# Patient Record
Sex: Male | Born: 1995 | Race: White | Hispanic: No | Marital: Single | Smoking: Never smoker
Health system: Southern US, Community
[De-identification: ages and names within clinical notes are randomized; demographics above are authoritative.]

---
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
## 2014-05-16 ENCOUNTER — Emergency Department: Payer: Self-pay | Admitting: Emergency Medicine

## 2014-05-16 LAB — URINALYSIS, COMPLETE
BACTERIA: NONE SEEN
BILIRUBIN, UR: NEGATIVE
BLOOD: NEGATIVE
GLUCOSE, UR: NEGATIVE mg/dL (ref 0–75)
Ketone: NEGATIVE
LEUKOCYTE ESTERASE: NEGATIVE
Nitrite: NEGATIVE
PH: 7 (ref 4.5–8.0)
PROTEIN: NEGATIVE
RBC,UR: <1 /HPF (ref 0–5)
SPECIFIC GRAVITY: 1.018 (ref 1.003–1.030)
Squamous Epithelial: NONE SEEN

## 2014-05-16 LAB — COMPREHENSIVE METABOLIC PANEL
ALK PHOS: 60 U/L
ANION GAP: 7 (ref 7–16)
Albumin: 3.8 g/dL (ref 3.4–5.0)
BUN: 10 mg/dL (ref 7–18)
Bilirubin,Total: 0.4 mg/dL (ref 0.2–1.0)
CALCIUM: 8.8 mg/dL (ref 8.5–10.1)
Chloride: 106 mmol/L (ref 98–107)
Co2: 26 mmol/L (ref 21–32)
Creatinine: 1.23 mg/dL (ref 0.60–1.30)
EGFR (African American): >60
Glucose: 118 mg/dL — ABNORMAL HIGH (ref 65–99)
Osmolality: 278 (ref 275–301)
POTASSIUM: 3.4 mmol/L — AB (ref 3.5–5.1)
SGOT(AST): 26 U/L (ref 15–37)
SGPT (ALT): 27 U/L
SODIUM: 139 mmol/L (ref 136–145)
TOTAL PROTEIN: 7.6 g/dL (ref 6.4–8.2)

## 2014-05-16 LAB — CBC WITH DIFFERENTIAL/PLATELET
Basophil #: 0.1 10*3/uL (ref 0.0–0.1)
Basophil %: 1.1 %
EOS ABS: 0.1 10*3/uL (ref 0.0–0.7)
Eosinophil %: 1.5 %
HCT: 45.7 % (ref 40.0–52.0)
HGB: 15.3 g/dL (ref 13.0–18.0)
LYMPHS PCT: 28.6 %
Lymphocyte #: 2.6 10*3/uL (ref 1.0–3.6)
MCH: 27.8 pg (ref 26.0–34.0)
MCHC: 33.6 g/dL (ref 32.0–36.0)
MCV: 83 fL (ref 80–100)
MONO ABS: 0.5 x10 3/mm (ref 0.2–1.0)
Monocyte %: 5.4 %
NEUTROS ABS: 5.8 10*3/uL (ref 1.4–6.5)
NEUTROS PCT: 63.4 %
Platelet: 213 10*3/uL (ref 150–440)
RBC: 5.52 10*6/uL (ref 4.40–5.90)
RDW: 13.8 % (ref 11.5–14.5)
WBC: 9.1 10*3/uL (ref 3.8–10.6)

## 2015-05-03 IMAGING — CT CT ABD-PELV W/ CM
2 of 4 series · 17 of 46 positions shown, 19 images · IV contrast (isovue)
Comparison: None.

CLINICAL DATA: Right lower quadrant abdominal pain for 1 week.

EXAM:
CT ABDOMEN AND PELVIS WITH CONTRAST
TECHNIQUE: Multidetector CT imaging of the abdomen and pelvis was performed
using the standard protocol following bolus administration of
intravenous contrast.
CONTRAST:  100 mL of Isovue 300 IV contrast

[Series 2: routine abd pel with · axial · 0.70mm/px · z∈[-506,-56]mm · 14 of 98 slices shown, 16 images]
[im 4/98  soft-tissue]
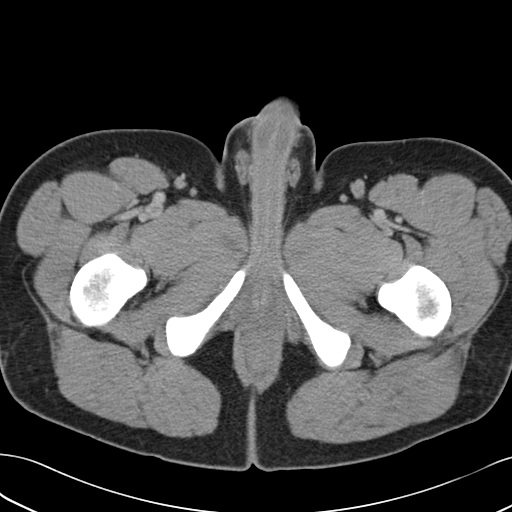
[im 4/98  bone]
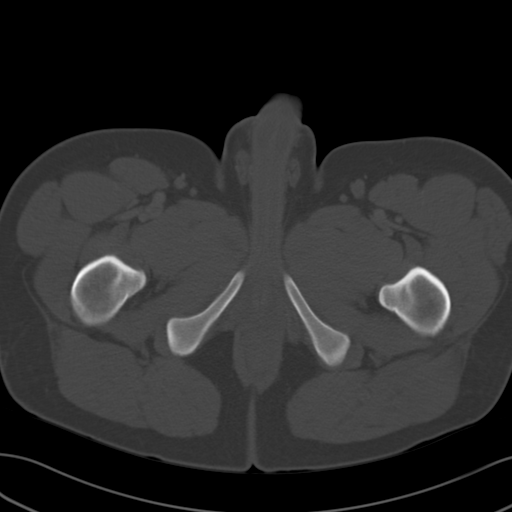
[im 12/98  soft-tissue]
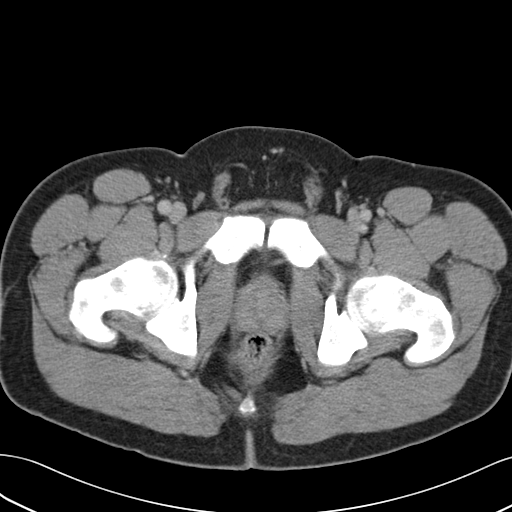
[im 20/98  soft-tissue]
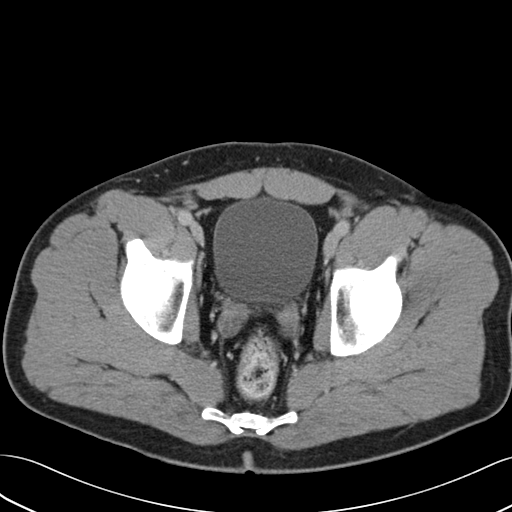
[im 28/98  soft-tissue]
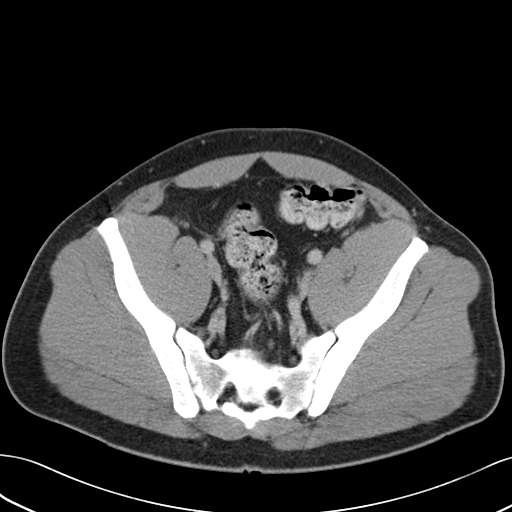
[im 32/98  soft-tissue]
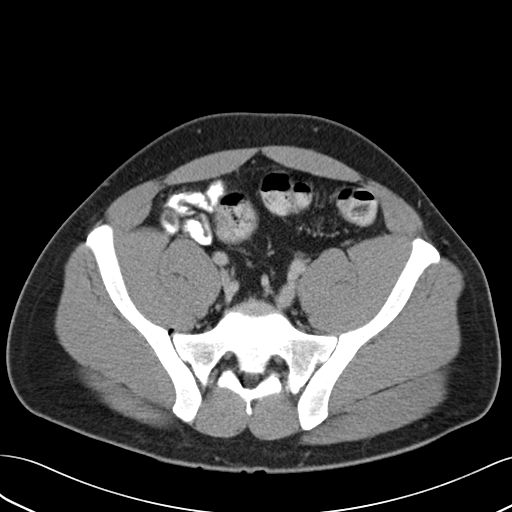
[im 39/98  soft-tissue]
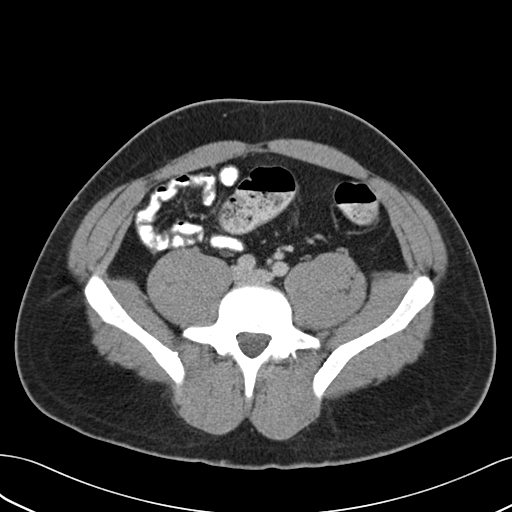
[im 47/98  soft-tissue]
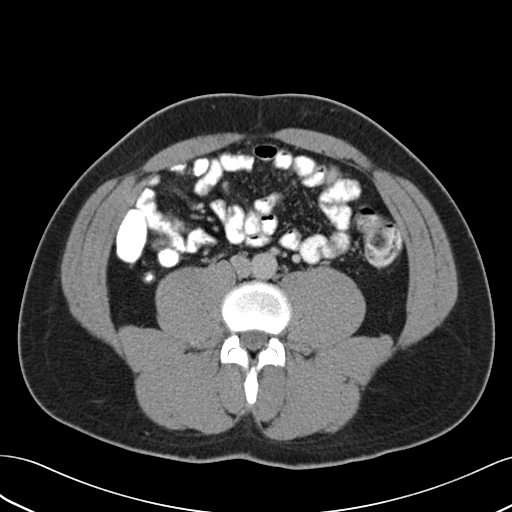
[im 51/98  soft-tissue]
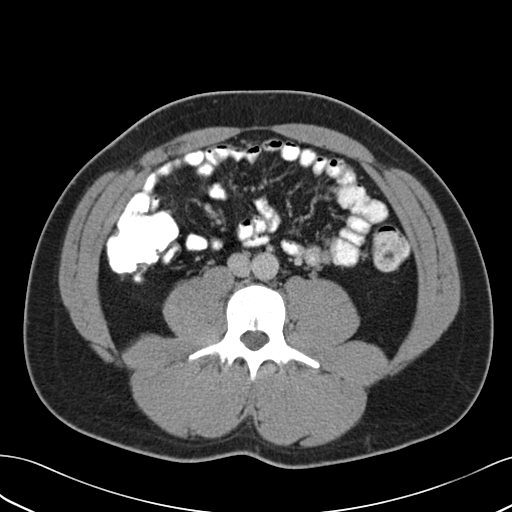
[im 59/98  soft-tissue]
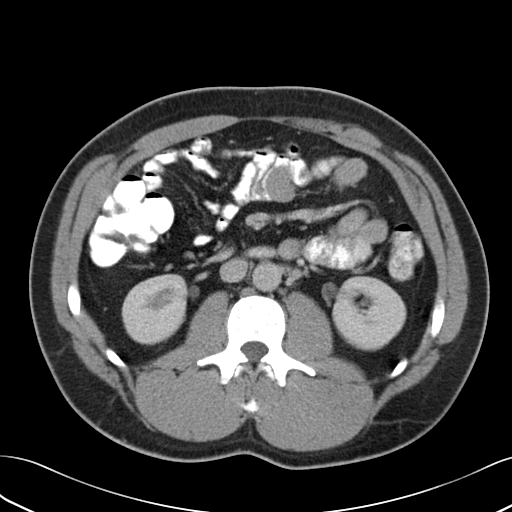
[im 59/98  bone]
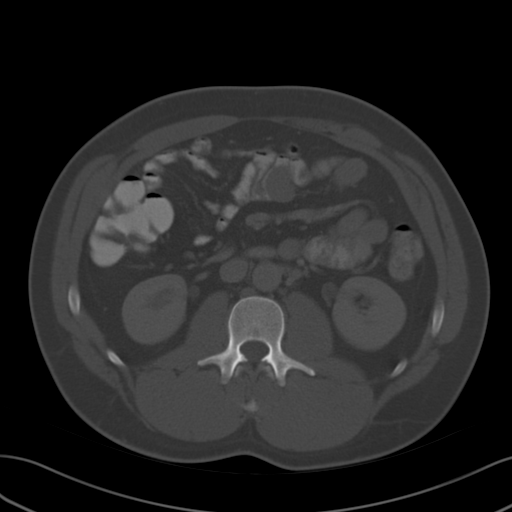
[im 66/98  soft-tissue]
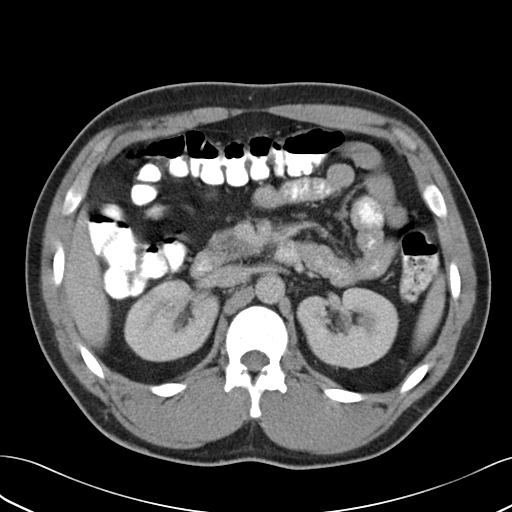
[im 74/98  soft-tissue]
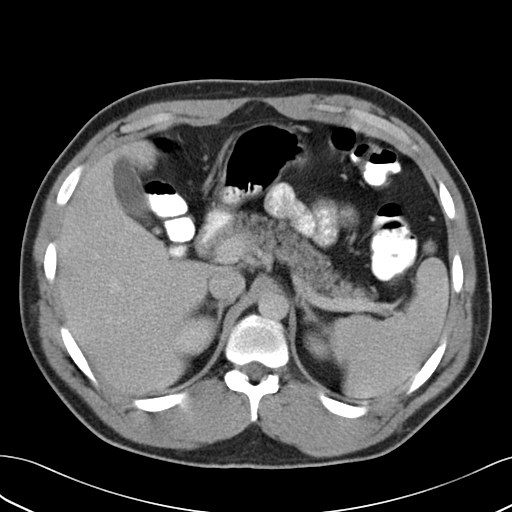
[im 78/98  soft-tissue]
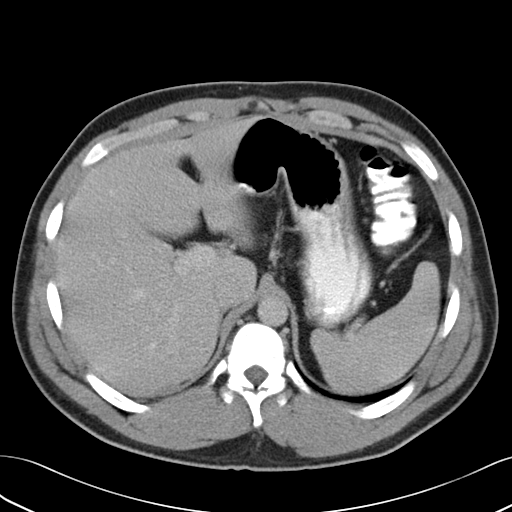
[im 86/98  soft-tissue]
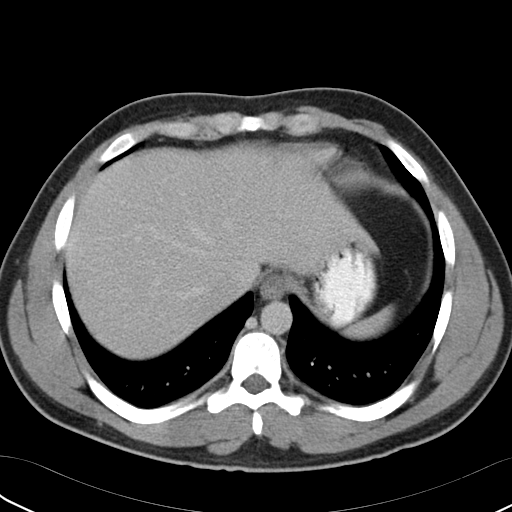
[im 94/98  soft-tissue]
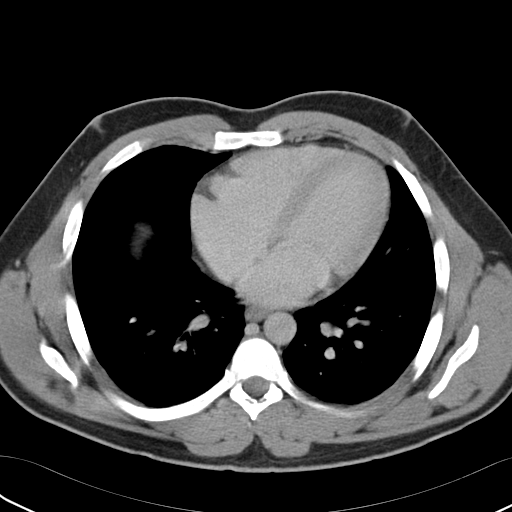

[Series 5: cor routine abd pel with · coronal · 0.99mm/px · 3 of 128 slices shown]
[im 43/128  soft-tissue]
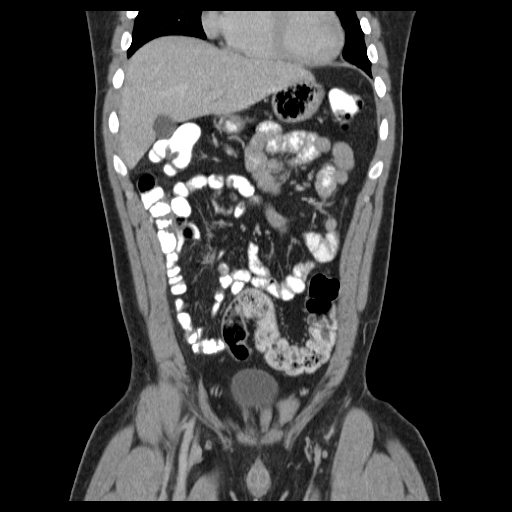
[im 57/128  soft-tissue]
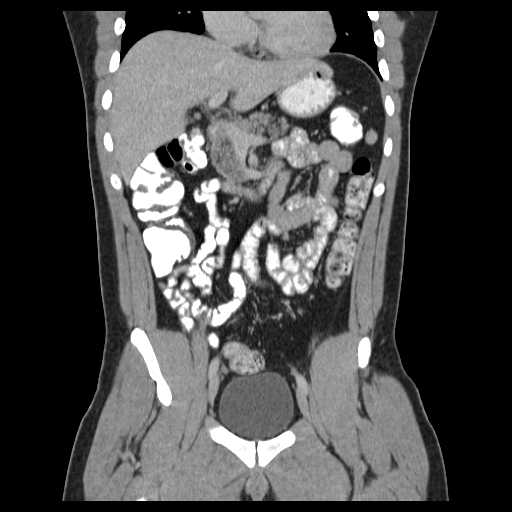
[im 71/128  soft-tissue]
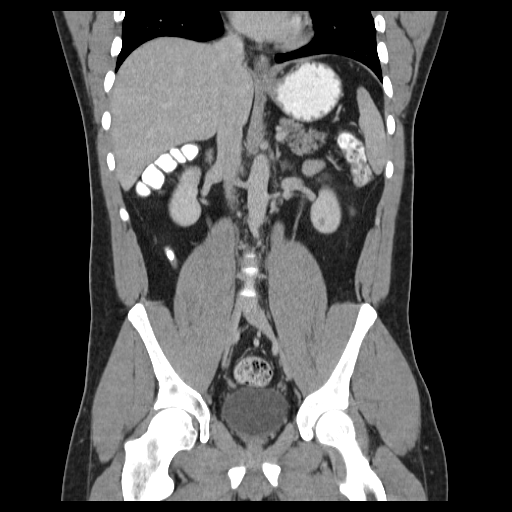

[17 of 46 positions shown; findings below may reference images not displayed]

FINDINGS: The visualized lung bases are clear.

The liver and spleen are unremarkable in appearance. The gallbladder
is within normal limits. The pancreas and adrenal glands are
unremarkable.

Two small foci of mildly decreased enhancement are noted within the
left renal parenchyma. This could reflect very mild pyelonephritis,
or possibly small poorly characterized cysts. There is no evidence
of hydronephrosis. No renal or ureteral stones are seen. No
perinephric stranding is appreciated.

No free fluid is identified. The small bowel is unremarkable in
appearance. The stomach is within normal limits. No acute vascular
abnormalities are seen.

The appendix is borderline normal in size. It is partially filled
with contrast. There is question of minimal associated soft tissue
inflammation, but this likely remains within normal limits, without
definite evidence for appendicitis.

Contrast progresses to the level of the rectum. The colon is
unremarkable in appearance.

The bladder is mildly distended and grossly unremarkable. The
prostate is normal in size. No inguinal lymphadenopathy is seen.

No acute osseous abnormalities are identified.
IMPRESSION: 1. No evidence of appendicitis.
2. Two small foci of mildly decreased enhancement noted within the
left renal parenchyma. This could reflect very mild pyelonephritis,
or possibly small cysts, difficult to fully characterize.

## 2017-01-29 ENCOUNTER — Emergency Department
Admission: EM | Admit: 2017-01-29 | Discharge: 2017-01-29 | Disposition: A | Discharge status: Home / Self Care | Payer: Medicaid Other | Attending: Student in an Organized Health Care Education/Training Program | Admitting: Student in an Organized Health Care Education/Training Program

## 2017-01-29 DIAGNOSIS — J029 Acute pharyngitis, unspecified: Secondary | ICD-10-CM | POA: Diagnosis not present

## 2017-01-29 DIAGNOSIS — R509 Fever, unspecified: Secondary | ICD-10-CM | POA: Diagnosis present

## 2017-01-29 LAB — INFLUENZA PANEL BY PCR (TYPE A & B)
INFLAPCR: NEGATIVE
Influenza B By PCR: NEGATIVE

## 2017-01-29 LAB — POCT RAPID STREP A: Streptococcus, Group A Screen (Direct): NEGATIVE

## 2017-01-29 MED ORDER — NAPROXEN 500 MG PO TABS: 500.0000 mg | ORAL_TABLET | Freq: Two times a day (BID) | ORAL | 0 refills | Status: AC

## 2017-01-29 MED ORDER — AMOXICILLIN 500 MG PO CAPS: 500.0000 mg | ORAL_CAPSULE | Freq: Three times a day (TID) | ORAL | 0 refills | Status: AC

## 2017-01-29 MED ORDER — DEXAMETHASONE 6 MG PO TABS
10.0000 mg | ORAL_TABLET | Freq: Once | ORAL | Status: DC
  Filled 2017-01-29: qty 1

## 2017-01-29 MED ORDER — DEXAMETHASONE 4 MG PO TABS
10.0000 mg | ORAL_TABLET | Freq: Once | ORAL | Status: AC
  Administered 2017-01-29: 10 mg via ORAL
  Filled 2017-01-29: qty 2.5

## 2017-01-29 NOTE — ED Notes (Signed)
Pt c/o sore throat since last night, denies any fever. Pt in NAD at this time

## 2017-01-29 NOTE — ED Provider Notes (Signed)
[facility] Emergency Department Provider Note    First MD Initiated Contact with Patient 01/29/17 1824     (approximate)  I have reviewed the triage vital signs and the nursing notes.   HISTORY  Chief Complaint Fever and Sore Throat    HPI Bob Smith is a 21 y.o. male presents with a chief complaint of sore throat and fever that started abruptly yesterday. Did have fever to 103.9 taken at home last night. Also had brief episode of numbness and weakness to the outside of his left leg that occurred 1 hour prior to arrival. It seems to have resolved by now. Denies any shortness of breath. Does have a nonproductive cough. No recent known sick contacts. Has not tried to take anything at home for the pain.   History reviewed. No pertinent past medical history. FMH:  No bleeding disorders History reviewed. No pertinent surgical history. There are no active problems to display for this patient.     Prior to Admission medications   Medication Sig Start Date End Date Taking? Authorizing Provider  amoxicillin (AMOXIL) 500 MG capsule Take 1 capsule (500 mg total) by mouth 3 (three) times daily. 01/29/17 02/05/17  Willy Eddy, MD  naproxen (NAPROSYN) 500 MG tablet Take 1 tablet (500 mg total) by mouth 2 (two) times daily with a meal. 01/29/17 01/29/18  Willy Eddy, MD    Allergies Patient has no known allergies.    Social History Social History  Substance Use Topics  . Smoking status: Never Smoker  . Smokeless tobacco: Never Used  . Alcohol use Not on file    Review of Systems Patient denies headaches, rhinorrhea, blurry vision, numbness, shortness of breath, chest pain, edema, cough, abdominal pain, nausea, vomiting, diarrhea, dysuria, fevers, rashes or hallucinations unless otherwise stated above in HPI. ____________________________________________   PHYSICAL EXAM:  VITAL SIGNS: Vitals:   01/29/17 1804 01/29/17 2001  BP: 132/78     Pulse: (!) 105 99  Resp: 18 18  Temp: 99.8 F (37.7 C)     Constitutional: Alert and oriented. Well appearing and in no acute distress. Eyes: Conjunctivae are normal. PERRL. EOMI. Head: Atraumatic. Nose: No congestion/rhinnorhea. Mouth/Throat: Mucous membranes are moist.  Oropharynx erythematous. With bilateral exudates Neck: No stridor. Painless ROM. No cervical spine tenderness to palpation Hematological/Lymphatic/Immunilogical: No cervical lymphadenopathy. Cardiovascular: Normal rate, regular rhythm. Grossly normal heart sounds.  Good peripheral circulation. Respiratory: Normal respiratory effort.  No retractions. Lungs CTAB. Gastrointestinal: Soft and nontender. No distention. No abdominal bruits. No CVA tenderness. Musculoskeletal: No lower extremity tenderness nor edema.  No joint effusions. Neurologic:  CN- intact.  No facial droop, Normal FNF.  Normal heel to shin.  Sensation intact bilaterally. Normal speech and language. No gross focal neurologic deficits are appreciated. No gait instability.  Skin:  Skin is warm, dry and intact. No rash noted. Psychiatric: Mood and affect are normal. Speech and behavior are normal.  ____________________________________________   LABS (all labs ordered are listed, but only abnormal results are displayed)  Results for orders placed or performed during the hospital encounter of 01/29/17 (from the past 24 hour(s))  Influenza panel by PCR (type A & B)     Status: None   Collection Time: 01/29/17  6:56 PM  Result Value Ref Range   Influenza A By PCR NEGATIVE NEGATIVE   Influenza B By PCR NEGATIVE NEGATIVE  POCT rapid strep A [facility])     Status: None   Collection Time: 01/29/17  7:05 PM  Result Value Ref Range   Streptococcus, Group A Screen (Direct) NEGATIVE NEGATIVE    ____________________________________________ __________________________________  RADIOLOGY   ____________________________________________   PROCEDURES  Procedure(s) performed:  Procedures    Critical Care performed: no ____________________________________________   INITIAL IMPRESSION / ASSESSMENT AND PLAN / ED COURSE  Pertinent labs & imaging results that were available during my care of the patient were reviewed by me and considered in my medical decision making (see chart for details).  DDX: uri, strep, mono, flu  LANELL DUBIE is a 21 y.o. who presents to the ED  with 1 day of sore throat and fever. Patient is AFVSS in ED. Exam as above. Given current presentation have considered the above differential. Not consistent with PTA or RPA as no muffled voice, uvula midline, no asymmetry. + exudates. Rapid strep negative no neck stiffness.flu negative.  Presentation most consistent with acute pharyngitis at this time. Will provide symptomatic treatment and strict return precautions.  Have discussed with the patient and available family all diagnostics and treatments performed thus far and all questions were answered to the best of my ability. The patient demonstrates understanding and agreement with plan.        ____________________________________________   FINAL CLINICAL IMPRESSION(S) / ED DIAGNOSES  Final diagnoses:  Pharyngitis, unspecified etiology      NEW MEDICATIONS STARTED DURING THIS VISIT:  New Prescriptions   AMOXICILLIN (AMOXIL) 500 MG CAPSULE    Take 1 capsule (500 mg total) by mouth 3 (three) times daily.   NAPROXEN (NAPROSYN) 500 MG TABLET    Take 1 tablet (500 mg total) by mouth 2 (two) times daily with a meal.     Note:  This document was prepared using Dragon voice recognition software and may include unintentional dictation errors.    Willy Eddy, MD 01/29/17 2005

## 2017-01-29 NOTE — ED Triage Notes (Signed)
Pt reports to ED w/ c/o sore throat, fever and L side weakness.  Pt sts that fever and sore throat began yesterday, reports a max temp of 103.9 at home. Pt afebrile in triage.  Pt also reports L side weakness that began 1 hour PTA.  Pt A/OX4, resp even and unlabored, ambulatory to triage w/o issue.  Pt negative on stroke scale, able top answer all questions w/o issue. NAD

## 2018-03-19 ENCOUNTER — Other Ambulatory Visit: Payer: Self-pay

## 2018-03-19 ENCOUNTER — Encounter: Payer: Self-pay | Admitting: Emergency Medicine

## 2018-03-19 ENCOUNTER — Emergency Department
Admission: EM | Admit: 2018-03-19 | Discharge: 2018-03-19 | Disposition: A | Discharge status: Home / Self Care | Payer: BLUE CROSS/BLUE SHIELD | Attending: Emergency Medicine | Admitting: Emergency Medicine

## 2018-03-19 DIAGNOSIS — W228XXA Striking against or struck by other objects, initial encounter: Secondary | ICD-10-CM | POA: Insufficient documentation

## 2018-03-19 DIAGNOSIS — Y929 Unspecified place or not applicable: Secondary | ICD-10-CM | POA: Insufficient documentation

## 2018-03-19 DIAGNOSIS — S81811A Laceration without foreign body, right lower leg, initial encounter: Secondary | ICD-10-CM | POA: Insufficient documentation

## 2018-03-19 DIAGNOSIS — Y999 Unspecified external cause status: Secondary | ICD-10-CM | POA: Insufficient documentation

## 2018-03-19 DIAGNOSIS — Y93E6 Activity, residential relocation: Secondary | ICD-10-CM | POA: Diagnosis not present

## 2018-03-19 NOTE — ED Provider Notes (Signed)
[facility] Emergency Department Provider Note  ____________________________________________  Time seen: Approximately 11:13 PM  I have reviewed the triage vital signs and the nursing notes.   HISTORY  Chief Complaint Laceration    HPI Bob Smith is a 22 y.o. male presents to the emergency department with a 2 cm, superficial laceration of the skin overlying the distal left thigh.  Patient reports that laceration was sustained while moving a small refrigerator.  No weakness, radiculopathy or changes in sensation of the lower extremities.  Tetanus status is up-to-date.   History reviewed. No pertinent past medical history.  There are no active problems to display for this patient.   History reviewed. No pertinent surgical history.  Prior to Admission medications   Not on File    Allergies Patient has no known allergies.  No family history on file.  Social History Social History   Tobacco Use  . Smoking status: Never Smoker  . Smokeless tobacco: Never Used  Substance Use Topics  . Alcohol use: Not Currently  . Drug use: Not Currently     Review of Systems  Constitutional: No fever/chills Eyes: No visual changes. No discharge ENT: No upper respiratory complaints. Cardiovascular: no chest pain. Respiratory: no cough. No SOB. Gastrointestinal: No abdominal pain.  No nausea, no vomiting.  No diarrhea.  No constipation. Musculoskeletal: Negative for musculoskeletal pain. Skin: Patient has left thigh laceration.  Neurological: Negative for headaches, focal weakness or numbness.   ____________________________________________   PHYSICAL EXAM:  VITAL SIGNS: ED Triage Vitals  Enc Vitals Group     BP 03/19/18 1843 129/79     Pulse Rate 03/19/18 1843 97     Resp 03/19/18 1843 18     Temp 03/19/18 1843 98.7 F (37.1 C)     Temp Source 03/19/18 1843 Oral     SpO2 03/19/18 1843 100 %     Weight 03/19/18 1844 214 lb (97.1 kg)      Height --      Head Circumference --      Peak Flow --      Pain Score 03/19/18 1844 0     Pain Loc --      Pain Edu? --      Excl. in GC? --      Constitutional: Alert and oriented. Well appearing and in no acute distress. Eyes: Conjunctivae are normal. PERRL. EOMI. Head: Atraumatic. Cardiovascular: Normal rate, regular rhythm. Normal S1 and S2.  Good peripheral circulation. Respiratory: Normal respiratory effort without tachypnea or retractions. Lungs CTAB. Good air entry to the bases with no decreased or absent breath sounds. Musculoskeletal: Full range of motion to all extremities. No gross deformities appreciated. Neurologic:  Normal speech and language. No gross focal neurologic deficits are appreciated.  Skin: Patient has 2 cm laceration of left thigh.  Psychiatric: Mood and affect are normal. Speech and behavior are normal. Patient exhibits appropriate insight and judgement.   ____________________________________________   LABS (all labs ordered are listed, but only abnormal results are displayed)  Labs Reviewed - No data to display ____________________________________________  EKG   ____________________________________________  RADIOLOGY   No results found.  ____________________________________________    PROCEDURES  Procedure(s) performed:    Procedures  LACERATION REPAIR Performed by: Orvil Feil Authorized by: Orvil Feil Consent: Verbal consent obtained. Risks and benefits: risks, benefits and alternatives were discussed Consent given by: patient Patient identity confirmed: provided demographic data Prepped and Draped in normal sterile fashion Wound explored  Laceration Location: Left thigh  Laceration Length: 2 cm  No Foreign Bodies seen or palpated  Irrigation method: syringe Amount of cleaning: standard  Skin closure: Dermabond   Patient tolerance: Patient tolerated the procedure well with no immediate  complications.   Medications - No data to display   ____________________________________________   INITIAL IMPRESSION / ASSESSMENT AND PLAN / ED COURSE  Pertinent labs & imaging results that were available during my care of the patient were reviewed by me and considered in my medical decision making (see chart for details).  Review of the [place] CSRS was performed in accordance of the [facility] prior to dispensing any controlled drugs.     Assessment and Plan: Patient has left thigh laceration Patient presents to the emergency department with a superficial left thigh laceration repaired with Dermabond in the emergency department.  Patient was advised to follow-up with primary care as needed.  ____________________________________________  FINAL CLINICAL IMPRESSION(S) / ED DIAGNOSES  Final diagnoses:  Leg laceration, right, initial encounter      NEW MEDICATIONS STARTED DURING THIS VISIT:  ED Discharge Orders    None          This chart was dictated using voice recognition software/Dragon. Despite best efforts to proofread, errors can occur which can change the meaning. Any change was purely unintentional.    Orvil Feil, PA-C 03/19/18 2328    Jene Every, MD 03/20/18 [phone]

## 2018-03-19 NOTE — ED Notes (Signed)
Patient states that he was putting in a small refrigerator and sat it on his knee. Patient with laceration to left knee. Bleeding controlled at this time.

## 2018-03-19 NOTE — ED Triage Notes (Signed)
Pt to ED via POV for laceration on right knee. Bleeding is controlled at this time. Pt is in NAD.

## 2018-11-16 ENCOUNTER — Emergency Department
Admission: EM | Admit: 2018-11-16 | Discharge: 2018-11-16 | Disposition: A | Discharge status: Home / Self Care | Payer: Self-pay | Attending: Emergency Medicine | Admitting: Emergency Medicine

## 2018-11-16 ENCOUNTER — Other Ambulatory Visit: Payer: Self-pay

## 2018-11-16 DIAGNOSIS — J111 Influenza due to unidentified influenza virus with other respiratory manifestations: Secondary | ICD-10-CM | POA: Insufficient documentation

## 2018-11-16 DIAGNOSIS — R69 Illness, unspecified: Secondary | ICD-10-CM

## 2018-11-16 MED ORDER — BENZONATATE 100 MG PO CAPS: 100.0000 mg | ORAL_CAPSULE | Freq: Three times a day (TID) | ORAL | 0 refills | Status: AC | PRN

## 2018-11-16 MED ORDER — FLUTICASONE PROPIONATE 50 MCG/ACT NA SUSP: 1.0000 | Freq: Every day | NASAL | 2 refills | Status: DC

## 2018-11-16 NOTE — ED Triage Notes (Signed)
Pt in with co fever, cough, congestion, sore throat and generalized weakness since yesterday.

## 2018-11-16 NOTE — ED Provider Notes (Signed)
[facility] Emergency Department Provider Note  ____________________________________________  Time seen: Approximately 10:09 PM  I have reviewed the triage vital signs and the nursing notes.   HISTORY  Chief Complaint Nasal Congestion    HPI Bob Smith is a 23 y.o. male presents to the emergency department with fever, rhinorrhea, congestion and nonproductive cough that started today.  Patient has known sick contacts in the home with influenza.  No emesis or diarrhea.  Patient is tolerating fluids by mouth and his own secretions.  No chest pain, chest tightness, shortness of breath, nausea, vomiting abdominal pain.  Patient's past medical history is unremarkable and he takes no medications chronically.  No alleviating measures have been attempted.   No past medical history on file.  There are no active problems to display for this patient.   No past surgical history on file.  Prior to Admission medications   Not on File    Allergies Patient has no known allergies.  No family history on file.  Social History Social History   Tobacco Use  . Smoking status: Never Smoker  . Smokeless tobacco: Never Used  Substance Use Topics  . Alcohol use: Not Currently  . Drug use: Not Currently      Review of Systems  Constitutional: Patient has fever.  Eyes: No visual changes. No discharge ENT: Patient has congestion.  Cardiovascular: no chest pain. Respiratory: Patient has cough.  Gastrointestinal: No abdominal pain.  No nausea, no vomiting. No diarrhea.  Genitourinary: Negative for dysuria. No hematuria Musculoskeletal: No myalgias.  Skin: Negative for rash, abrasions, lacerations, ecchymosis. Neurological: Patient has headache, no focal weakness or numbness.    ____________________________________________   PHYSICAL EXAM:  VITAL SIGNS: ED Triage Vitals  Enc Vitals Group     BP 11/16/18 2139 124/69     Pulse Rate 11/16/18 2139 95     Resp 11/16/18 2139 18     Temp 11/16/18 2139 98.4 F (36.9 C)     Temp Source 11/16/18 2139 Oral     SpO2 11/16/18 2139 99 %     Weight 11/16/18 2139 220 lb (99.8 kg)     Height 11/16/18 2139 5\' 7"  (1.702 m)     Head Circumference --      Peak Flow --      Pain Score 11/16/18 2142 5     Pain Loc --      Pain Edu? --      Excl. in GC? --      Constitutional: Alert and oriented. Patient is lying supine. Eyes: Conjunctivae are normal. PERRL. EOMI. Head: Atraumatic. ENT:      Ears: Tympanic membranes are mildly injected with mild effusion bilaterally.       Nose: No congestion/rhinnorhea.      Mouth/Throat: Mucous membranes are moist. Posterior pharynx is mildly erythematous.  Hematological/Lymphatic/Immunilogical: No cervical lymphadenopathy.  Cardiovascular: Normal rate, regular rhythm. Normal S1 and S2.  Good peripheral circulation. Respiratory: Normal respiratory effort without tachypnea or retractions. Lungs CTAB. Good air entry to the bases with no decreased or absent breath sounds. Gastrointestinal: Bowel sounds 4 quadrants. Soft and nontender to palpation. No guarding or rigidity. No palpable masses. No distention. No CVA tenderness. Musculoskeletal: Full range of motion to all extremities. No gross deformities appreciated. Neurologic:  Normal speech and language. No gross focal neurologic deficits are appreciated.  Skin:  Skin is warm, dry and intact. No rash noted. Psychiatric: Mood and affect are normal. Speech and behavior are normal.  Patient exhibits appropriate insight and judgement.     ____________________________________________   LABS (all labs ordered are listed, but only abnormal results are displayed)  Labs Reviewed - No data to display ____________________________________________  EKG   ____________________________________________  RADIOLOGY  No results found.  ____________________________________________    PROCEDURES  Procedure(s)  performed:    Procedures    Medications - No data to display   ____________________________________________   INITIAL IMPRESSION / ASSESSMENT AND PLAN / ED COURSE  Pertinent labs & imaging results that were available during my care of the patient were reviewed by me and considered in my medical decision making (see chart for details).  Review of the [place] CSRS was performed in accordance of the [facility] prior to dispensing any controlled drugs.      Assessment and plan Influenza-like illness Patient presents to the emergency department with rhinorrhea, congestion, nonproductive cough and fever that started today.  History and physical exam findings are suggestive of an influenza-like illness.  Patient declined treatment with Tamiflu.  [facility] and Flonase.  Rest hydration were encouraged at home.  Tylenol and ibuprofen alternating for fever were recommended.  Patient was advised to follow-up with primary care as needed.  Strict return precautions were given to return for new or worsening symptoms.  All patient questions were answered.    ____________________________________________  FINAL CLINICAL IMPRESSION(S) / ED DIAGNOSES  Final diagnoses:  Influenza-like illness      NEW MEDICATIONS STARTED DURING THIS VISIT:  ED Discharge Orders    None          This chart was dictated using voice recognition software/Dragon. Despite best efforts to proofread, errors can occur which can change the meaning. Any change was purely unintentional.    Orvil FeilWoods, Jaclyn M, PA-C 11/16/18 2213    Emily FilbertWilliams, Jonathan E, MD 11/16/18 [phone]

## 2019-06-22 ENCOUNTER — Other Ambulatory Visit: Payer: Self-pay

## 2019-06-22 ENCOUNTER — Emergency Department
Admission: EM | Admit: 2019-06-22 | Discharge: 2019-06-22 | Disposition: A | Discharge status: Home / Self Care | Payer: Self-pay | Attending: Emergency Medicine | Admitting: Emergency Medicine

## 2019-06-22 ENCOUNTER — Encounter: Payer: Self-pay | Admitting: Emergency Medicine

## 2019-06-22 DIAGNOSIS — Z5321 Procedure and treatment not carried out due to patient leaving prior to being seen by health care provider: Secondary | ICD-10-CM | POA: Insufficient documentation

## 2019-06-22 DIAGNOSIS — M79646 Pain in unspecified finger(s): Secondary | ICD-10-CM | POA: Insufficient documentation

## 2019-06-22 NOTE — ED Triage Notes (Signed)
Pt in via POV, presents with pain/pressure to base of nail bed due to hang nail x 2 days.  NAD noted at this time.

## 2020-11-19 ENCOUNTER — Other Ambulatory Visit: Payer: Self-pay

## 2020-11-19 ENCOUNTER — Emergency Department (HOSPITAL_COMMUNITY)
Admission: EM | Admit: 2020-11-19 | Discharge: 2020-11-19 | Disposition: A | Discharge status: Home / Self Care | Payer: Self-pay | Attending: Emergency Medicine | Admitting: Emergency Medicine

## 2020-11-19 DIAGNOSIS — S61219A Laceration without foreign body of unspecified finger without damage to nail, initial encounter: Secondary | ICD-10-CM | POA: Insufficient documentation

## 2020-11-19 DIAGNOSIS — Z5321 Procedure and treatment not carried out due to patient leaving prior to being seen by health care provider: Secondary | ICD-10-CM | POA: Insufficient documentation

## 2020-11-19 DIAGNOSIS — W268XXA Contact with other sharp object(s), not elsewhere classified, initial encounter: Secondary | ICD-10-CM | POA: Insufficient documentation

## 2022-04-08 ENCOUNTER — Ambulatory Visit (INDEPENDENT_AMBULATORY_CARE_PROVIDER_SITE_OTHER): Payer: Self-pay | Admitting: Urology

## 2022-04-08 ENCOUNTER — Encounter: Payer: Self-pay | Admitting: Urology

## 2022-04-08 VITALS — BP 119/79 | HR 89 | Ht 68.0 in | Wt 215.0 lb

## 2022-04-08 DIAGNOSIS — N529 Male erectile dysfunction, unspecified: Secondary | ICD-10-CM

## 2022-04-08 NOTE — Progress Notes (Signed)
   04/08/2022 8:35 AM   Bob Smith 21-Sep-1996 [id number]  Referring provider: Randa Spike, DO [phone] [facility] [address] [facility] [place],  [place] [address]  Chief Complaint  Patient presents with   Erectile Dysfunction    HPI: Bob Smith is a 26 y.o. male referred for evaluation of erectile dysfunction  2-3 month history of erectile dysfunction Previously had no problems and states a friend talked him into trying Royal Honey supplement He took this in February and states shortly after he began to have difficulty achieving and maintaining an erection.  Erections with decrease rigidity but firm enough for penetration.  He will be unable to maintain the erection after ~ 1 minute No penile curvature or pain with erections No history of diabetes, hypertension, antihypertensive medication, neurologic problems or tobacco use Good libido and no tiredness or fatigue SHIM 8/25  PMH: History reviewed. No pertinent past medical history.  Surgical History: History reviewed. No pertinent surgical history.  Home Medications:  Allergies as of 04/08/2022       Reactions   Amoxicillin Anaphylaxis        Medication List        Accurate as of April 08, 2022  8:35 AM. If you have any questions, ask your nurse or doctor.          STOP taking these medications    fluticasone 50 MCG/ACT nasal spray Commonly known as: Flonase Stopped by: Riki Altes, MD        Allergies:  Allergies  Allergen Reactions   Amoxicillin Anaphylaxis    Family History: History reviewed. No pertinent family history.  Social History:  reports that he has never smoked. He has never used smokeless tobacco. He reports that he does not currently use alcohol. He reports that he does not currently use drugs.   Physical Exam: BP 119/79   Pulse 89   Ht 5\' 8"  (1.727 m)   Wt 215 lb (97.5 kg)   BMI 32.69 kg/m   Constitutional:  Alert and oriented, No acute  distress. HEENT: [place] AT Respiratory: Normal respiratory effort, no increased work of breathing. GU: Phallus is circumcised without lesions/plaques, testes descended bilaterally without masses or tenderness with a volume >20 cc bilaterally.  Spermatic cord/epididymis palpably normal bilaterally. Psychiatric: Normal mood and affect.    Assessment & Plan:    1.  Erectile dysfunction No organic risk factors No abnormalities on exam We discussed the pathophysiology of ED and that 70% of men with ED are secondary to decreased arterial inflow and small percentage is related to venoocclusive disease, neurologic problems and less commonly hormonal abnormalities We discussed that acute onset of ED can be related to a psychogenic etiology/performance anxiety Testosterone level drawn today I did offer him a PDE 5 inhibitor trial however he wanted to see wait on testosterone results   , MD  [facility] [address] [place], [place] [phone]

## 2022-04-09 ENCOUNTER — Telehealth: Payer: Self-pay

## 2022-04-09 LAB — TESTOSTERONE: Testosterone: 630 ng/dL (ref 264–916)

## 2022-04-09 NOTE — Telephone Encounter (Signed)
Patient informed of lab results. He would like to try the medication discussed in clinic

## 2022-04-09 NOTE — Telephone Encounter (Signed)
-----   Message from Riki Altes, MD sent at 04/09/2022  1:03 PM EDT ----- Testosterone level was normal at 630

## 2022-04-10 MED ORDER — TADALAFIL 20 MG PO TABS: ORAL_TABLET | ORAL | 0 refills | Status: AC

## 2022-04-10 NOTE — Telephone Encounter (Signed)
Pt informed medication was sent.
